# Patient Record
Sex: Male | Born: 1935 | Race: White | Hispanic: No | Marital: Married | Smoking: Former smoker
Health system: Southern US, Community
[De-identification: ages and names within clinical notes are randomized; demographics above are authoritative.]

## PROBLEM LIST (undated history)

## (undated) DIAGNOSIS — I10 Essential (primary) hypertension: Secondary | ICD-10-CM

## (undated) DIAGNOSIS — E119 Type 2 diabetes mellitus without complications: Secondary | ICD-10-CM

## (undated) HISTORY — DX: Essential (primary) hypertension: I10

## (undated) HISTORY — DX: Type 2 diabetes mellitus without complications: E11.9

---

## 2007-11-25 ENCOUNTER — Ambulatory Visit: Payer: Self-pay | Admitting: Gastroenterology

## 2009-03-02 ENCOUNTER — Ambulatory Visit: Payer: Self-pay | Admitting: Specialist

## 2009-03-09 ENCOUNTER — Ambulatory Visit: Payer: Self-pay | Admitting: Specialist

## 2009-05-20 ENCOUNTER — Ambulatory Visit (HOSPITAL_COMMUNITY): Admission: RE | Admit: 2009-05-20 | Discharge: 2009-05-21 | Payer: Self-pay | Admitting: Orthopedic Surgery

## 2009-12-01 ENCOUNTER — Ambulatory Visit: Payer: Self-pay

## 2009-12-08 ENCOUNTER — Ambulatory Visit: Payer: Self-pay | Admitting: Family Medicine

## 2009-12-08 ENCOUNTER — Ambulatory Visit: Payer: Self-pay

## 2009-12-12 ENCOUNTER — Emergency Department: Payer: Self-pay | Admitting: Internal Medicine

## 2009-12-16 ENCOUNTER — Ambulatory Visit: Payer: Self-pay

## 2009-12-21 ENCOUNTER — Ambulatory Visit: Payer: Self-pay

## 2009-12-28 ENCOUNTER — Ambulatory Visit: Payer: Self-pay

## 2009-12-29 ENCOUNTER — Ambulatory Visit: Payer: Self-pay | Admitting: Family Medicine

## 2010-01-02 ENCOUNTER — Ambulatory Visit: Payer: Self-pay

## 2010-01-03 ENCOUNTER — Ambulatory Visit: Payer: Self-pay | Admitting: Internal Medicine

## 2010-01-09 ENCOUNTER — Ambulatory Visit: Payer: Self-pay

## 2010-01-12 ENCOUNTER — Ambulatory Visit: Payer: Self-pay | Admitting: Family Medicine

## 2010-01-13 ENCOUNTER — Ambulatory Visit: Payer: Self-pay

## 2010-01-23 ENCOUNTER — Ambulatory Visit: Payer: Self-pay | Admitting: Internal Medicine

## 2010-01-25 ENCOUNTER — Ambulatory Visit: Payer: Self-pay

## 2010-01-26 ENCOUNTER — Ambulatory Visit: Payer: Self-pay | Admitting: Family Medicine

## 2010-01-30 ENCOUNTER — Ambulatory Visit: Payer: Self-pay

## 2010-02-02 ENCOUNTER — Ambulatory Visit: Payer: Self-pay

## 2010-02-07 ENCOUNTER — Ambulatory Visit: Payer: Self-pay

## 2010-02-09 ENCOUNTER — Ambulatory Visit: Payer: Self-pay

## 2010-02-28 ENCOUNTER — Ambulatory Visit: Payer: Self-pay | Admitting: Internal Medicine

## 2010-03-03 ENCOUNTER — Ambulatory Visit: Payer: Self-pay | Admitting: Internal Medicine

## 2010-03-07 ENCOUNTER — Ambulatory Visit: Payer: Self-pay

## 2010-03-10 ENCOUNTER — Ambulatory Visit: Payer: Self-pay

## 2010-03-15 ENCOUNTER — Ambulatory Visit: Payer: Self-pay

## 2010-03-17 ENCOUNTER — Other Ambulatory Visit: Payer: Self-pay | Admitting: Family Medicine

## 2010-03-22 ENCOUNTER — Ambulatory Visit: Payer: Self-pay

## 2010-03-24 ENCOUNTER — Ambulatory Visit: Payer: Self-pay

## 2010-03-27 ENCOUNTER — Ambulatory Visit: Payer: Self-pay

## 2010-04-03 ENCOUNTER — Ambulatory Visit: Payer: Self-pay

## 2010-04-07 ENCOUNTER — Ambulatory Visit: Payer: Self-pay

## 2010-04-13 ENCOUNTER — Ambulatory Visit: Payer: Self-pay

## 2010-06-13 ENCOUNTER — Ambulatory Visit (HOSPITAL_COMMUNITY)
Admission: RE | Admit: 2010-06-13 | Discharge: 2010-06-13 | Disposition: A | Discharge status: Home / Self Care | Payer: Medicare Other | Source: Ambulatory Visit | Attending: Orthopedic Surgery | Admitting: Orthopedic Surgery

## 2010-06-13 ENCOUNTER — Other Ambulatory Visit (HOSPITAL_COMMUNITY): Payer: Self-pay | Admitting: Orthopedic Surgery

## 2010-06-13 ENCOUNTER — Other Ambulatory Visit: Payer: Self-pay | Admitting: Orthopedic Surgery

## 2010-06-13 ENCOUNTER — Encounter (HOSPITAL_COMMUNITY): Payer: Medicare Other

## 2010-06-13 DIAGNOSIS — Z01810 Encounter for preprocedural cardiovascular examination: Secondary | ICD-10-CM | POA: Insufficient documentation

## 2010-06-13 DIAGNOSIS — Z981 Arthrodesis status: Secondary | ICD-10-CM | POA: Insufficient documentation

## 2010-06-13 DIAGNOSIS — Z01812 Encounter for preprocedural laboratory examination: Secondary | ICD-10-CM | POA: Insufficient documentation

## 2010-06-13 DIAGNOSIS — M169 Osteoarthritis of hip, unspecified: Secondary | ICD-10-CM | POA: Insufficient documentation

## 2010-06-13 DIAGNOSIS — I440 Atrioventricular block, first degree: Secondary | ICD-10-CM | POA: Insufficient documentation

## 2010-06-13 DIAGNOSIS — Z01818 Encounter for other preprocedural examination: Secondary | ICD-10-CM

## 2010-06-13 DIAGNOSIS — M1610 Unilateral primary osteoarthritis, unspecified hip: Secondary | ICD-10-CM | POA: Insufficient documentation

## 2010-06-13 LAB — URINALYSIS, ROUTINE W REFLEX MICROSCOPIC
Ketones, ur: NEGATIVE mg/dL
Nitrite: NEGATIVE
Protein, ur: NEGATIVE mg/dL
Urobilinogen, UA: 1 mg/dL (ref 0.0–1.0)
pH: 6 (ref 5.0–8.0)

## 2010-06-13 LAB — PROTIME-INR
INR: 1.79 — ABNORMAL HIGH (ref 0.00–1.49)
Prothrombin Time: 21 seconds — ABNORMAL HIGH (ref 11.6–15.2)

## 2010-06-13 LAB — COMPREHENSIVE METABOLIC PANEL
ALT: 22 U/L (ref 0–53)
AST: 24 U/L (ref 0–37)
Albumin: 3.5 g/dL (ref 3.5–5.2)
Alkaline Phosphatase: 60 U/L (ref 39–117)
BUN: 25 mg/dL — ABNORMAL HIGH (ref 6–23)
Chloride: 98 mEq/L (ref 96–112)
Potassium: 3.8 mEq/L (ref 3.5–5.1)
Total Bilirubin: 0.7 mg/dL (ref 0.3–1.2)

## 2010-06-13 LAB — CBC
HCT: 37 % — ABNORMAL LOW (ref 39.0–52.0)
Hemoglobin: 12.4 g/dL — ABNORMAL LOW (ref 13.0–17.0)
MCHC: 33.5 g/dL (ref 30.0–36.0)
WBC: 9.6 10*3/uL (ref 4.0–10.5)

## 2010-06-13 LAB — SURGICAL PCR SCREEN: MRSA, PCR: NEGATIVE

## 2010-06-19 ENCOUNTER — Other Ambulatory Visit: Payer: Self-pay | Admitting: Orthopedic Surgery

## 2010-06-19 ENCOUNTER — Inpatient Hospital Stay (HOSPITAL_COMMUNITY)
Admission: RE | Admit: 2010-06-19 | Discharge: 2010-06-22 | DRG: 470 | Disposition: A | Discharge status: Home Health | Payer: Medicare Other | Source: Ambulatory Visit | Attending: Orthopedic Surgery | Admitting: Orthopedic Surgery

## 2010-06-19 DIAGNOSIS — G4733 Obstructive sleep apnea (adult) (pediatric): Secondary | ICD-10-CM | POA: Diagnosis present

## 2010-06-19 DIAGNOSIS — I4891 Unspecified atrial fibrillation: Secondary | ICD-10-CM | POA: Diagnosis present

## 2010-06-19 DIAGNOSIS — N400 Enlarged prostate without lower urinary tract symptoms: Secondary | ICD-10-CM | POA: Diagnosis present

## 2010-06-19 DIAGNOSIS — E7800 Pure hypercholesterolemia, unspecified: Secondary | ICD-10-CM | POA: Diagnosis present

## 2010-06-19 DIAGNOSIS — E119 Type 2 diabetes mellitus without complications: Secondary | ICD-10-CM | POA: Diagnosis present

## 2010-06-19 DIAGNOSIS — I10 Essential (primary) hypertension: Secondary | ICD-10-CM | POA: Diagnosis present

## 2010-06-19 DIAGNOSIS — M169 Osteoarthritis of hip, unspecified: Principal | ICD-10-CM | POA: Diagnosis present

## 2010-06-19 DIAGNOSIS — M1610 Unilateral primary osteoarthritis, unspecified hip: Principal | ICD-10-CM | POA: Diagnosis present

## 2010-06-19 LAB — TYPE AND SCREEN: Antibody Screen: NEGATIVE

## 2010-06-19 LAB — GLUCOSE, CAPILLARY
Glucose-Capillary: 139 mg/dL — ABNORMAL HIGH (ref 70–99)
Glucose-Capillary: 194 mg/dL — ABNORMAL HIGH (ref 70–99)

## 2010-06-19 LAB — ABO/RH: ABO/RH(D): AB POS

## 2010-06-20 LAB — CBC
MCV: 93.1 fL (ref 78.0–100.0)
Platelets: 212 10*3/uL (ref 150–400)
RBC: 3.48 MIL/uL — ABNORMAL LOW (ref 4.22–5.81)
RDW: 16.9 % — ABNORMAL HIGH (ref 11.5–15.5)

## 2010-06-20 LAB — GLUCOSE, CAPILLARY
Glucose-Capillary: 162 mg/dL — ABNORMAL HIGH (ref 70–99)
Glucose-Capillary: 156 mg/dL — ABNORMAL HIGH (ref 70–99)
Glucose-Capillary: 139 mg/dL — ABNORMAL HIGH (ref 70–99)
Glucose-Capillary: 145 mg/dL — ABNORMAL HIGH (ref 70–99)

## 2010-06-20 LAB — BASIC METABOLIC PANEL
BUN: 20 mg/dL (ref 6–23)
Calcium: 8.3 mg/dL — ABNORMAL LOW (ref 8.4–10.5)
Creatinine, Ser: 0.99 mg/dL (ref 0.4–1.5)
GFR calc non Af Amer: >60 mL/min (ref >60)
Glucose, Bld: 150 mg/dL — ABNORMAL HIGH (ref 70–99)

## 2010-06-20 LAB — PROTIME-INR: INR: 1.18 (ref 0.00–1.49)

## 2010-06-21 LAB — PROTIME-INR
INR: 1.67 — ABNORMAL HIGH (ref 0.00–1.49)
Prothrombin Time: 19.9 seconds — ABNORMAL HIGH (ref 11.6–15.2)

## 2010-06-21 LAB — GLUCOSE, CAPILLARY: Glucose-Capillary: 150 mg/dL — ABNORMAL HIGH (ref 70–99)

## 2010-06-21 LAB — BASIC METABOLIC PANEL
BUN: 12 mg/dL (ref 6–23)
CO2: 26 mEq/L (ref 19–32)
Chloride: 99 mEq/L (ref 96–112)
Creatinine, Ser: 0.88 mg/dL (ref 0.4–1.5)

## 2010-06-21 LAB — CBC
MCHC: 33.8 g/dL (ref 30.0–36.0)
MCV: 91.8 fL (ref 78.0–100.0)
RDW: 16.6 % — ABNORMAL HIGH (ref 11.5–15.5)
WBC: 11.9 10*3/uL — ABNORMAL HIGH (ref 4.0–10.5)

## 2010-06-22 LAB — PROTIME-INR
INR: 2.49 — ABNORMAL HIGH (ref 0.00–1.49)
Prothrombin Time: 27 seconds — ABNORMAL HIGH (ref 11.6–15.2)

## 2010-06-22 LAB — CBC
MCHC: 33.2 g/dL (ref 30.0–36.0)
RDW: 16.7 % — ABNORMAL HIGH (ref 11.5–15.5)

## 2010-06-22 LAB — GLUCOSE, CAPILLARY: Glucose-Capillary: 144 mg/dL — ABNORMAL HIGH (ref 70–99)

## 2010-06-22 LAB — BASIC METABOLIC PANEL
BUN: 20 mg/dL (ref 6–23)
Creatinine, Ser: 1.04 mg/dL (ref 0.4–1.5)
GFR calc non Af Amer: >60 mL/min (ref >60)

## 2010-06-29 NOTE — Op Note (Signed)
NAME:  [name] [name], [name] [name]                ACCOUNT NO.:  [id number]  MEDICAL RECORD NO.:  [id number]           PATIENT TYPE:  I  LOCATION:  0011                         FACILITY:  [facility]  PHYSICIAN:  Ollen Gross, M.D.    DATE OF BIRTH:  1935/11/18  DATE OF PROCEDURE:  06/19/2010 DATE OF DISCHARGE:  06/13/2010                              OPERATIVE REPORT   PREOPERATIVE DIAGNOSIS:  Osteoarthritis, left hip.  POSTOPERATIVE DIAGNOSIS:  Osteoarthritis, left hip.  PROCEDURE:  Left total hip arthroplasty.  SURGEON:  Ollen Gross, M.D.  ASSISTANT:  Alexzandrew L. Perkins, P.A.C.  ANESTHESIA:  General.  ESTIMATED BLOOD LOSS:  600.  DRAIN:  Hemovac x1.  COMPLICATIONS:  None.  CONDITION:  Stable to the recovery room.  BRIEF CLINICAL NOTE:  [name] is a 75 year old male with advanced end- stage arthritis of the left hip, progressively worsening pain and dysfunction.  He has failed nonoperative management and presents for total hip arthroplasty.  PROCEDURE IN DETAIL:  After successful administration of general anesthetic, patient is placed in right lateral decubitus position with the left side up and held with the hip positioner.  Left lower extremity isolated from his perineum of plastic drapes and prepped and draped in the usual sterile fashion.  Short posterolateral incision is made with a 10 blade through the subcutaneous tissue to the fascia lata which incised in line with the skin incision.  Sciatic nerve was palpated and protected and short rotators and capsule isolated off the femur.  Hip is dislocated and the center of the femoral head is marked.  A trial prosthesis is placed such that the center of the trial head corresponds to the center of his native femoral head.  Osteotomy line is marked on the femoral neck and osteotomy made with an oscillating saw.  Femoral head is then removed.  Retractors were placed around the proximal femur to gain exposure of the femoral  canal.  Starter reamer was passed and then the canal was thoroughly irrigated with saline to remove fatty contents.  Axial reaming is performed up to 17.5 mm proximal reaming to a 61F and the sleeve machined to a small.  A 61F small trial sleeve was then placed.  The femur was retracted anteriorly to gain acetabular exposure. Acetabular retractors were placed and the labrum and osteophytes removed. Acetabular informed to 53 mm, replacement with 54-mm Pinnacle acetabular shell.  This was placed in anatomic position and transfixed with 2 dome screws.  The 36 mm neutral +4 marathon liner was then placed.  The trial femoral stem which of a 22 x 17 with a 36 +8 neck matching native anteversion is placed.  A 36 +0 head is placed.  Hips reduced with outstanding stability.  There is full extension, full external rotation, 70 degrees flexion, 40 degrees adduction, about 80 degrees internal rotation, 90 degrees of flexion, and 70 degrees of internal rotation.  By placing the left leg on top of the right, it felt that the leg lengths were equal.  Hip was then dislocated.  All trials are removed.  The permanent 61F small sleeve was placed and a  22 x 17 stem with a 36 +8 neck was impacted matching native anteversion.  A 36 +0 head is placed and the hip was reduced with the same stability parameters.  The wound was copiously irrigated with saline solution and short rotators and capsule reattached to the femur through drill holes with Ethibond suture.  The fascia lata was closed over Hemovac drain with interrupted #1 Vicryl, subcutaneous closed with #1 and  #2-0 Vicryl, and subcuticular running 4-0 Monocryl.  The catheter for the Marcaine pain pump is placed and the pump is initiated.  The incisions cleaned and dried, and Steri-Strips and a bulky sterile dressing are applied.  He has been placed into a knee immobilizer, awakened, and transferred to recovery in stable condition.     Ollen Gross,  M.D.     FA/MEDQ  D:  06/19/2010  T:  06/19/2010  Job:  [id number]  Electronically Signed by Ollen Gross M.D. on 06/28/2010 03:46:01 PM

## 2010-07-10 LAB — COMPREHENSIVE METABOLIC PANEL
AST: 28 U/L (ref 0–37)
CO2: 28 mEq/L (ref 19–32)
Calcium: 9.2 mg/dL (ref 8.4–10.5)
Creatinine, Ser: 1.05 mg/dL (ref 0.4–1.5)
GFR calc Af Amer: >60 mL/min (ref >60)
GFR calc non Af Amer: >60 mL/min (ref >60)

## 2010-07-10 LAB — DIFFERENTIAL
Eosinophils Relative: 2 % (ref 0–5)
Lymphocytes Relative: 30 % (ref 12–46)
Lymphs Abs: 1.8 10*3/uL (ref 0.7–4.0)
Neutro Abs: 3.7 10*3/uL (ref 1.7–7.7)

## 2010-07-10 LAB — CBC
MCHC: 34.6 g/dL (ref 30.0–36.0)
MCV: 97.9 fL (ref 78.0–100.0)
Platelets: 290 10*3/uL (ref 150–400)
RBC: 3.95 MIL/uL — ABNORMAL LOW (ref 4.22–5.81)

## 2010-07-10 LAB — PROTIME-INR
INR: 1.09 (ref 0.00–1.49)
Prothrombin Time: 14 seconds (ref 11.6–15.2)

## 2010-07-10 LAB — APTT: aPTT: 32 seconds (ref 24–37)

## 2010-07-26 NOTE — Discharge Summary (Signed)
NAME:  [name] [name]                ACCOUNT NO.:  [id number]  MEDICAL RECORD NO.:  [id number]           PATIENT TYPE:  I  LOCATION:  1601                         FACILITY:  [facility]  PHYSICIAN:  Ollen Gross, M.D.    DATE OF BIRTH:  Aug 31, 1935  DATE OF ADMISSION:  06/19/2010 DATE OF DISCHARGE:  06/22/2010                              DISCHARGE SUMMARY   ADMITTING DIAGNOSES: 1. Osteoarthritis, left hip. 2. Hypertension. 3. Sleep apnea. 4. Hypercholesterolemia. 5. History of paroxysmal atrial fibrillation. 6. Non-insulin-dependent diabetes mellitus. 7. Mild benign prostatic hypertrophy. 8. Past history of left leg cellulitis.  DISCHARGE DIAGNOSES: 1. Osteoarthritis, left hip, status post left total hip replacement     and arthroplasty. 2. Postop hyponatremia. 3. Hypertension. 4. Sleep apnea. 5. Hypercholesterolemia. 6. History of paroxysmal atrial fibrillation. 7. Non-insulin-dependent diabetes mellitus. 8. Mild benign prostatic hypertrophy. 9. Past history of left leg cellulitis.  PROCEDURE:  June 19, 2010, left total hip.  SURGEON:  Ollen Gross, MD  ASSISTANT:  Alexzandrew L. Perkins, PA-C  ANESTHESIA:  General.  BLOOD LOSS:  600 mL.  CONSULTS:  None.  BRIEF HISTORY:  [name] [name] is a 75 year old male with advanced arthritis of left hip, progressive worsening pain and dysfunction.  He has failed nonoperative management, now presents for total hip arthroplasty.  LABORATORY DATA:  Preoperative CBC showed hemoglobin 12.4, hematocrit 37.0, white cell count was 9.6, platelets 212.  Chem panel on admission showed elevated BUN of 25, glucose elevated at 136, known diabetic. Remaining Chem panel within normal limits.  PT/INR 21.0 and 1.79 with a PTT of 65.  Preop UA showed small bili, otherwise negative.  Nasal swabs were positive for staph aureus but negative for MRSA.  Serial CBCs were followed.  Hemoglobin dropped down to 10.6, back up to 11.  Last H and H 10.1  and 30.4.  Serial protime followed per Coumadin protocol.  Last PT/INR 27.0 and 2.49.  Serial BMETs were followed.  Glucose went up to 150, back down to 126.  Sodium did drop down to 132, was last noted at 131.  Remaining electrolytes remained within normal limits.  Blood group type AB positive.  HOSPITAL COURSE:  The patient was admitted to [facility] and taken to the operating room, underwent the above-stated procedure without complication.  The patient tolerated the procedure well and later transferred to recovery room at orthopedic floor, started on p.o. and IV analgesic pain control following surgery, did fairly well on the evening of surgery but on the early morning hours of postop day #1, the patient developed some nausea and vomiting and did get up from sleep, give him antiemetics as needed.  The Pradaxa which the patient was on preoperatively was held postoperatively.  He was started back on his blood pressure meds and his heart regulatory medications because of the history of paroxysmal atrial fibrillation.  He had decent output.  We followed his urine output daily.  By day #2, his hemoglobin was stable at 11.  Dressing was changed, incision looked good.  The Sodium was down a little bit down to 132.  He started to progress  with his therapy.  By day #3, he was meeting his goals.  His sodium was 131.  We will allow to concentrate up on its own. The incision looked good.  He was progressing with his therapy and the patient was discharged home at this time.  DISCHARGE PLAN: 1. The patient was discharged home on June 22, 2010. 2. Discharge diagnoses, please see above. 3. Discharge medications, Percocet, Robaxin, Coumadin.  Continue     amiodarone, digoxin, furosemide, glipizide, Lipitor, lisinopril,     metoprolol, morphine sulfate, potassium chloride, and tamsulosin.  DIET:  Heart-healthy diet.  ACTIVITY:  Partial weightbearing 25 to 50%.  Hip precautions  total protocol.  Home health PT for therapy.  DIET:  Heart-healthy diet.  DISPOSITION:  Home.  CONDITION ON DISCHARGE:  Improved.     Alexzandrew L. Julien Girt, P.A.C.   ______________________________ Ollen Gross, M.D.    ALP/MEDQ  D:  07/20/2010  T:  07/20/2010  Job:  [id number]  cc:   Dr. Alden Hipp  Electronically Signed by Patrica Duel P.A.C. on 07/26/2010 62:13:08 AM Electronically Signed by Ollen Gross M.D. on 07/26/2010 09:53:31 AM

## 2010-07-26 NOTE — H&P (Signed)
  NAME:  [name] [name], [name] [name]                ACCOUNT NO.:  [id number]  MEDICAL RECORD NO.:  [id number]           PATIENT TYPE:  I  LOCATION:  1601                         FACILITY:  [facility]  PHYSICIAN:  Ollen Gross, M.D.    DATE OF BIRTH:  1935/08/11  DATE OF ADMISSION:  06/19/2010 DATE OF DISCHARGE:  06/22/2010                             HISTORY & PHYSICAL   CHIEF COMPLAINT:  Left hip pain.  HISTORY OF PRESENT ILLNESS:  The patient is a 75 year old male who is seen by Dr. Lequita Halt for ongoing left hip pain.  He has known arthritis and has been progressively getting worse with time.  It is felt he would benefit from undergoing surgical intervention.  Risks and benefits have been discussed.  He elects to proceed with surgery.  ALLERGIES:  No known drug allergies.  CURRENT MEDICATIONS: 1. Lipitor. 2. Lisinopril. 3. Metoprolol. 4. Pradaxa. 5. Tamsulosin. 6. Potassium. 7. Furosemide. 8. Amiodarone. 9. Glipizide. 10.Digoxin.  PAST MEDICAL HISTORY: 1. Hypertension. 2. Sleep apnea, uses CPAP. 3. Hypercholesterolemia. 4. Recent bout of atrial fibrillation, paroxysmal in nature.  He has     had a cardioversion. 5. Non-insulin diabetes mellitus. 6. Mild BPH. 7. History of left leg cellulitis.  PAST SURGICAL HISTORY:  Rotator cuff, left shoulder.  FAMILY HISTORY:  Father with heart attack.  Mother deceased from natural causes.  SOCIAL HISTORY:  Married, retired, past smoker, quit about 2 years ago, 6 ounces of alcohol per week.  He does have caregiver lined up, has 2 steps entering his home.  Does have a living will and healthcare power of attorney.  REVIEW OF SYSTEMS:  GENERAL:  No fevers, chills, night sweats. NEUROLOGIC:  No seizure, syncope, or paralysis.  RESPIRATORY:  No shortness breath, productive cough, or hemoptysis.  CARDIOVASCULAR:  No chest pain or orthopnea.  GI:  No nausea, vomiting, diarrhea, or constipation.  GU:  No dysuria, hematuria, or  discharge. MUSCULOSKELETAL:  Left hip.  PHYSICAL EXAMINATION:  VITAL SIGNS:  Pulse 80, respirations 12, blood pressure 142/64. GENERAL:  A 75 year old white male well-nourished, well-developed, overweight, obese, no acute distress.  He is alert, oriented, cooperative, accompanied by his wife. HEENT:  Normocephalic, atraumatic.  Pupils are round and reactive.  EOMs intact. NECK:  Supple. CHEST:  Clear, barrel-chested individual. HEART:  Regular rate and rhythm without murmur. ABDOMEN:  Soft, round, protuberant.  Bowel sounds present. RECTAL, BREASTS, GENITALIA:  Not done and not pertinent to present illness. EXTREMITIES:  Left hip flexion 90, internal rotation 20 degrees, external rotation 20 degrees abduction.  IMPRESSION:  Osteoarthritis, left hip.  PLAN:  The patient will be admitted to [facility] to undergo a left total replacement arthroplasty.  Surgery will be performed by Dr. Ollen Gross.     Alexzandrew L. Julien Girt, P.A.C.   ______________________________ Ollen Gross, M.D.    ALP/MEDQ  D:  06/25/2010  T:  06/26/2010  Job:  [id number]  cc:   Ollen Gross, M.D. Fax: [phone]  Dr. Alden Hipp  Electronically Signed by Patrica Duel P.A.C. on 07/26/2010 07:18:36 AM Electronically Signed by Ollen Gross M.D. on 07/26/2010 09:53:33 AM

## 2010-08-11 ENCOUNTER — Ambulatory Visit: Payer: Self-pay | Admitting: Internal Medicine

## 2010-09-22 ENCOUNTER — Ambulatory Visit: Payer: Self-pay | Admitting: Unknown Physician Specialty

## 2010-09-26 ENCOUNTER — Ambulatory Visit: Payer: Self-pay | Admitting: General Surgery

## 2010-09-28 LAB — PATHOLOGY REPORT

## 2010-10-02 ENCOUNTER — Emergency Department: Payer: Self-pay | Admitting: Emergency Medicine

## 2010-10-27 ENCOUNTER — Encounter (HOSPITAL_COMMUNITY)
Admission: RE | Admit: 2010-10-27 | Discharge: 2010-10-27 | Disposition: A | Discharge status: Home / Self Care | Payer: Medicare Other | Source: Ambulatory Visit | Attending: Orthopedic Surgery | Admitting: Orthopedic Surgery

## 2010-10-27 LAB — COMPREHENSIVE METABOLIC PANEL
AST: 50 U/L — ABNORMAL HIGH (ref 0–37)
Albumin: 3.4 g/dL — ABNORMAL LOW (ref 3.5–5.2)
BUN: 21 mg/dL (ref 6–23)
CO2: 25 mEq/L (ref 19–32)
Calcium: 9.6 mg/dL (ref 8.4–10.5)
Creatinine, Ser: 1.09 mg/dL (ref 0.50–1.35)
GFR calc non Af Amer: >60 mL/min (ref >60)

## 2010-10-27 LAB — DIFFERENTIAL
Basophils Absolute: 0 10*3/uL (ref 0.0–0.1)
Basophils Relative: 0 % (ref 0–1)
Eosinophils Relative: 2 % (ref 0–5)
Lymphs Abs: 2 10*3/uL (ref 0.7–4.0)
Monocytes Relative: 6 % (ref 3–12)
Neutro Abs: 4.7 10*3/uL (ref 1.7–7.7)
Neutrophils Relative %: 64 % (ref 43–77)

## 2010-10-27 LAB — PROTIME-INR
INR: 2.24 — ABNORMAL HIGH (ref 0.00–1.49)
Prothrombin Time: 25.2 seconds — ABNORMAL HIGH (ref 11.6–15.2)

## 2010-10-27 LAB — CBC
Hemoglobin: 10.2 g/dL — ABNORMAL LOW (ref 13.0–17.0)
Platelets: 211 10*3/uL (ref 150–400)
RBC: 3.83 MIL/uL — ABNORMAL LOW (ref 4.22–5.81)
WBC: 7.2 10*3/uL (ref 4.0–10.5)

## 2010-10-27 LAB — SURGICAL PCR SCREEN
MRSA, PCR: NEGATIVE
Staphylococcus aureus: POSITIVE — AB

## 2010-10-31 ENCOUNTER — Ambulatory Visit (HOSPITAL_COMMUNITY)
Admission: RE | Admit: 2010-10-31 | Discharge: 2010-11-01 | Disposition: A | Discharge status: Home / Self Care | Payer: Medicare Other | Source: Ambulatory Visit | Attending: Orthopedic Surgery | Admitting: Orthopedic Surgery

## 2010-10-31 DIAGNOSIS — F411 Generalized anxiety disorder: Secondary | ICD-10-CM | POA: Insufficient documentation

## 2010-10-31 DIAGNOSIS — Z01810 Encounter for preprocedural cardiovascular examination: Secondary | ICD-10-CM | POA: Insufficient documentation

## 2010-10-31 DIAGNOSIS — M25819 Other specified joint disorders, unspecified shoulder: Secondary | ICD-10-CM | POA: Insufficient documentation

## 2010-10-31 DIAGNOSIS — M19019 Primary osteoarthritis, unspecified shoulder: Secondary | ICD-10-CM | POA: Insufficient documentation

## 2010-10-31 DIAGNOSIS — Z79899 Other long term (current) drug therapy: Secondary | ICD-10-CM | POA: Insufficient documentation

## 2010-10-31 DIAGNOSIS — Z7901 Long term (current) use of anticoagulants: Secondary | ICD-10-CM | POA: Insufficient documentation

## 2010-10-31 DIAGNOSIS — M129 Arthropathy, unspecified: Secondary | ICD-10-CM | POA: Insufficient documentation

## 2010-10-31 DIAGNOSIS — Z87891 Personal history of nicotine dependence: Secondary | ICD-10-CM | POA: Insufficient documentation

## 2010-10-31 DIAGNOSIS — G4733 Obstructive sleep apnea (adult) (pediatric): Secondary | ICD-10-CM | POA: Insufficient documentation

## 2010-10-31 DIAGNOSIS — K219 Gastro-esophageal reflux disease without esophagitis: Secondary | ICD-10-CM | POA: Insufficient documentation

## 2010-10-31 DIAGNOSIS — S43429A Sprain of unspecified rotator cuff capsule, initial encounter: Secondary | ICD-10-CM | POA: Insufficient documentation

## 2010-10-31 DIAGNOSIS — I4891 Unspecified atrial fibrillation: Secondary | ICD-10-CM | POA: Insufficient documentation

## 2010-10-31 DIAGNOSIS — G8929 Other chronic pain: Secondary | ICD-10-CM | POA: Insufficient documentation

## 2010-10-31 DIAGNOSIS — Z01812 Encounter for preprocedural laboratory examination: Secondary | ICD-10-CM | POA: Insufficient documentation

## 2010-10-31 DIAGNOSIS — X58XXXA Exposure to other specified factors, initial encounter: Secondary | ICD-10-CM | POA: Insufficient documentation

## 2010-10-31 DIAGNOSIS — I10 Essential (primary) hypertension: Secondary | ICD-10-CM | POA: Insufficient documentation

## 2010-10-31 DIAGNOSIS — E119 Type 2 diabetes mellitus without complications: Secondary | ICD-10-CM | POA: Insufficient documentation

## 2010-10-31 LAB — APTT: aPTT: 29 seconds (ref 24–37)

## 2010-10-31 LAB — GLUCOSE, CAPILLARY: Glucose-Capillary: 107 mg/dL — ABNORMAL HIGH (ref 70–99)

## 2010-10-31 LAB — TYPE AND SCREEN: ABO/RH(D): AB POS

## 2010-10-31 LAB — ABO/RH: ABO/RH(D): AB POS

## 2010-10-31 LAB — SAMPLE TO BLOOD BANK

## 2010-10-31 LAB — PROTIME-INR: Prothrombin Time: 15.6 seconds — ABNORMAL HIGH (ref 11.6–15.2)

## 2010-11-01 LAB — CBC
Hemoglobin: 10.1 g/dL — ABNORMAL LOW (ref 13.0–17.0)
MCH: 26.4 pg (ref 26.0–34.0)
Platelets: 245 10*3/uL (ref 150–400)
RBC: 3.82 MIL/uL — ABNORMAL LOW (ref 4.22–5.81)
WBC: 10.4 10*3/uL (ref 4.0–10.5)

## 2010-11-01 LAB — COMPREHENSIVE METABOLIC PANEL
ALT: 28 U/L (ref 0–53)
AST: 38 U/L — ABNORMAL HIGH (ref 0–37)
Alkaline Phosphatase: 62 U/L (ref 39–117)
CO2: 23 mEq/L (ref 19–32)
Calcium: 8.3 mg/dL — ABNORMAL LOW (ref 8.4–10.5)
Glucose, Bld: 138 mg/dL — ABNORMAL HIGH (ref 70–99)
Potassium: 4.1 mEq/L (ref 3.5–5.1)
Sodium: 133 mEq/L — ABNORMAL LOW (ref 135–145)
Total Protein: 6.2 g/dL (ref 6.0–8.3)

## 2010-11-01 LAB — LIPID PANEL
HDL: 28 mg/dL — ABNORMAL LOW (ref >39)
LDL Cholesterol: 37 mg/dL (ref 0–99)
Total CHOL/HDL Ratio: 3.8 RATIO

## 2010-11-01 LAB — APTT: aPTT: 32 seconds (ref 24–37)

## 2010-11-01 LAB — DIGOXIN LEVEL: Digoxin Level: 0.3 ng/mL — ABNORMAL LOW (ref 0.8–2.0)

## 2010-11-01 LAB — PROTIME-INR: Prothrombin Time: 15.2 seconds (ref 11.6–15.2)

## 2010-11-01 LAB — GLUCOSE, CAPILLARY: Glucose-Capillary: 137 mg/dL — ABNORMAL HIGH (ref 70–99)

## 2010-11-05 NOTE — Op Note (Signed)
NAME:  [name] [name], [name] [name]                ACCOUNT NO.:  [id number]  MEDICAL RECORD NO.:  [id number]  LOCATION:  2019                         FACILITY:  [facility]  PHYSICIAN:  Dionne Ano. Bay Jarquin, M.D.DATE OF BIRTH:  May 13, 1935  DATE OF PROCEDURE: DATE OF DISCHARGE:                              OPERATIVE REPORT   PREOPERATIVE DIAGNOSES: 1. Right shoulder rotator cuff tear with impingement syndrome. 2. Acromioclavicular joint arthritis and chronic pain with inability     to lift the arm.  POSTOPERATIVE DIAGNOSES: 1. Right shoulder rotator cuff tear with impingement syndrome. 2. Acromioclavicular joint arthritis and chronic pain with inability     to lift the arm.  PROCEDURES: 1. Arthroscopy, right shoulder with labral debridement and biceps     tenotomy. 2. Arthroscopic synovectomy of the glenohumeral joint. 3. Arthroscopic subacromial decompression and bursectomy. 4. Arthroscopic distal clavicle resection. 5. Mini open rotator cuff repair.  This with a massive cuff tear     involving infra and supraspinatus tendons.  SURGEON:  Dionne Ano. Amanda Pea, MD  ASSISTANT:  Karie Chimera, PAC.  COMPLICATIONS:  None.  ANESTHESIA:  General with preoperative block.  TOURNIQUET TIME:  Zero.  ESTIMATED BLOOD LOSS:  Minimal.  INDICATIONS FOR THE PROCEDURE:  A 75 year old male who had an acute traumatic rotator cuff tear.  He understands risks and benefits of the surgery and desires to proceed.  MRI confirms the above-mentioned diagnosis as the subjective examined.  He understands risks and benefits as I had previously performed a left rotator cuff repair on him in the past as well.  OPERATION:  After seeing him by myself and Anesthesia, he was taken to the operating suite and underwent smooth induction of general anesthesia.  He was laid spine, fully padded, prepped and draped in usual sterile fashion.  Betadine scrub and paint followed by DuraPrep about the arm.  Prior to sterile prep and  drape, the patient was placed in beach-chair position.  SCDs were placed on his legs and time-out was called with consent verified.  The patient was well padded, pulses were checked, and the operation commenced with sterile draping.  Once this was done, outline marks were made, and I placed a posterior incision about the soft spot.  Following this, cannula was placed inside the shoulder joint.  The shoulder was then insufflated and evaluation was accomplished.  He had a torn biceps tendon and labral tearing.  I performed a biceps tenotomy through an anterior working portal, which I created with small stab incision after localization with spinal needle. Through the anterior portal, I performed a synovectomy.  I also performed a biceps tenotomy given the frayed biceps tendon.  Following this, I performed a labral debridement with arthroscopic shaver and thermal ablator.  I made sure that the thermal ablator did not touch the cartilage and at all times made sure that the cartilage was not overheated.  Once this was done, the rotator cuff tear was evaluated.  I felt that it was acceptable for an attempted repair, but felt that a mini open would serve him best given the retraction.  At this time, I placed our scope in the subacromial space and performed a bursectomy.  Following  bursectomy, I performed a subacromial decompression with combination bur, ablator, and shaver.  Once this was done, I then performed a distal clavicle resection arthroscopically.  This was done through an anterior working portal. Thermal ablator, shaver, and arthroscopic bur were used to remove 9 mm of the distal clavicle.  Following this, I then irrigated copiously and made a small mini open incision.  Dissection was carried down.  The area between the anterior and middle deltoid raphe was created.  I took care not to go below 3-4 cm from the edge of the acromion.  Dissection was carried down.  Rotator cuff tear was  verified and was mobilized with combination of finger glove application and a Cobb elevator.  Stay suture was placed of 2-0 Vicryl this to the cuff and it was mobilized. Following this, 3 knots with 4 exiting sutures piece were placed just off the footprint, which I created.  I medialized the cuff somewhat due to the retraction in the severity.  Following this, the sutures were then placed through the medial portion of the rotator cuff with scorpion device.  I should note that the subscapularis was intact and was not heavily damaged.  I did not perform a formal biceps tenodesis as I felt the patient would do just fine with the biceps tenotomy given his age and activity level.  Following this, 6 knots were created via 12 sutures in the cuff and the cuff sat down nicely on the footprint.  Once this was done, combination of push lock anchors on the most anterior and posterior regions were placed and a Biomet headless anchor was used in similar fashion to the push lock to provide a pants-over-vest repair/double-tear repair of the rotator cuff.  The cuff looked great. I was very pleased with this, mobilized nicely, and there was no impingement.  I checked subacromial space and all areas and things looked very well.  I irrigated copiously as I did it multiple points during the operative procedure and then closed the deltoid raphe with 0 Vicryl followed by 3-0 Vicryl in the subcu and subcuticular stitch in the skin edge.  Portals were closed with Prolene.  The patient tolerated this well.  There were no complicating features.  All sponge, needle, and instrument counts were reported as correct. Drapes were removed.  He was taken to the postop recovery area in stable condition and will be monitored.  We will plan for observation, IV antibiotics, pain management, and our standard protocol postop for a severe/massive rotator cuff repair.  I will have him passive range of motion 0-6 weeks, active  assisted motion 6-10 weeks, and begin active range of motion at 10-12 weeks.  These notes have been discussed, all questions have been encouraged and answered.     Dionne Ano. Amanda Pea, M.D.     [facility]  D:  10/31/2010  T:  11/01/2010  Job:  [id number]  Electronically Signed by Dominica Severin M.D. on 11/05/2010 06:54:59 AM

## 2010-11-08 NOTE — Consult Note (Signed)
NAME:  [name] [name], [name] [name]                ACCOUNT NO.:  [id number]  MEDICAL RECORD NO.:  [id number]  LOCATION:  SDSC                         FACILITY:  [facility]  PHYSICIAN:  Standley Dakins, MD   DATE OF BIRTH:  [birth year]  DATE OF CONSULTATION:  10/31/2010 DATE OF DISCHARGE:                                CONSULTATION   REFERRING PHYSICIAN:  Dionne Ano. Gramig, MD  REASON FOR CONSULTATION:  Evaluation and management recommendations for hypertension, hyperlipidemia, atrial fibrillation, obstructive sleep apnea, and other medical problems.  HISTORY OF PRESENT ILLNESS:  This patient is a pleasant 75 year old male, who is in postop day #0 status post having rotator cuff surgery on the right shoulder.  The patient is seen in the PACU.  He recently had surgery by Dr. Amanda Pea and is postop.  The patient has a history of hypertension, hyperlipidemia, diabetes mellitus type 2, atrial fibrillation, and anemia.  The patient was cardioverted at [facility] and has remained on Pradaxa and has had a controlled rate for quite some time.  The patient reports that his other medical conditions have been relatively stable.  He follows closely with [facility] for cardiology care and ambulatory care. Postoperatively, the patient has done well.  His heart rate has remained sinus rhythm and rate controlled at approximately 60.  His blood pressures have remained well controlled.  His Pradaxa had been stopped preoperatively and per the orthopedic specialist will be restarted tomorrow evening.  PAST MEDICAL HISTORY: 1. Hypertension. 2. Anxiety disorder. 3. Past smoker, stopped in June 2010. 4. Morbid obesity. 5. Erectile dysfunction. 6. Diabetes mellitus type 2. 7. Atrial fibrillation diagnosed in July 2011, on Pradaxa for     anticoagulation. 8. Obstructive sleep apnea - severe. 9. Hyperlipidemia. 10.Anemia. 11.Status post cervical spine surgery. 12.Status post cervical  diskectomy in 2007 and status post left     rotator cuff repair.  HOME MEDICATIONS: 1. Benadryl 25 mg 2 tablets p.o. daily at bedtime p.r.n. 2. Potassium chloride 20 mEq p.o. daily. 3. Pradaxa 150 mg p.o. b.i.d. 4. Tamsulosin 0.4 mg 1 capsule every morning. 5. Metoprolol tartrate 100 mg one p.o. twice daily. 6. Lisinopril 20 mg one p.o. twice daily. 7. Lipitor 20 mg one p.o. daily every evening. 8. Glipizide XL 2.5 mg one p.o. every morning. 9. Furosemide 40 mg 2 tablets p.o. every morning. 10.Digoxin 0.125 mg p.o. every other day. 11.Amiodarone 200 mg 1 tablet p.o. every morning.  ALLERGIES:  No known drug allergies.  FAMILY HISTORY:  Significant for heart disease in his father and brother.  SOCIAL HISTORY:  This patient is married.  He is retired.  He is a former smoker.  He quit in 2010.  Reports about 6 ounces of alcohol per week.  He does have a living will and health care power of attorney in the possession of his family.  REVIEW OF SYSTEMS:  Significant for right shoulder pain, fatigue, no shortness of breath, no chest pain, no rash, no significant edema. Please see HPI for positives.  Otherwise all systems reviewed completely and reported as negative.  PHYSICAL EXAMINATION:  CURRENT VITAL SIGNS:  Temperature 98.4, pulse 57, respirations 18, blood pressure 135/70, pulse  ox 95% on room air. GENERAL:  A 75 year old male, appears stated age, in no distress, cooperative and pleasant, awake, alert, and oriented. HEENT:  Normocephalic, atraumatic.  Pupils equally round and reactive to light.  Sclera clear.  Extraocular movements intact. NECK:  Supple.  Thyroid soft.  No nodules or masses palpated.  No JVD. LUNGS:  Bilateral breath sounds, clear to auscultation, barrel-chested. CARDIAC:  Normal S1 and S2 sounds, regular without murmurs, rubs, or gallops. ABDOMEN:  Obese, round, soft.  Bowel sounds present.  No hepatosplenomegaly, guarding, or rebound tenderness  noted. EXTREMITIES:  SCDs bilateral lower extremities.  No pretibial edema, cyanosis, or clubbing.  Pedal pulses 2+ bilaterally.  The patient's right shoulder is postop in a sling at this time. NEUROLOGICAL:  No focal deficits.  Awake, alert, and oriented x4. Moving all extremities and no focal deficits. SKIN:  No gross lesions noted.  LABORATORY DATA:  Blood type AB positive.  PTT 29, PT 15.6, INR 1.21. Sodium 136, potassium 5.2, chloride 99, bicarb 25, glucose 111, BUN 21, creatinine 1.09, bilirubin 0.5, AST 50, ALT 25, calcium 9.6.  White blood cell count of 7.2, hemoglobin 10.2, hematocrit 31.7, platelet count 211.  These labs were from October 27, 2010 except for the hematology labs as mentioned above, which were done on October 31, 2010.  IMPRESSION: 1. Atrial fibrillation status post cardioversion, chronically     anticoagulated with Pradaxa. 2. Hypertension. 3. Severe obstructive sleep apnea. 4. Hyperlipidemia. 5. Anemia. 6. Anxiety disorder. 7. Diabetes mellitus type 2. 8. Morbid obesity. 9. Postop day 0 status post right rotator cuff repair.  PLAN COMMENTS AND RECOMMENDATIONS: 1. For safety purposes, the patient is going to be admitted to a     telemetry monitored bed to evaluate and catch any possible     postoperative atrial fibrillation.  The patient will be restarted     on Pradaxa tomorrow evening. 2. Monitor blood glucose closely and provide supplemental insulin to     keep blood glucose controlled. 3. Monitor blood pressure closely. 4. Resume home medications for blood pressure and resume     antiarrhythmics. 5. Monitor electrolytes and CBC in the morning. 6. Because the patient has severe obstructive sleep apnea, I am     recommending that he have his CPAP tonight.  I am going to order     for respiratory therapy to please start him on CPAP tonight. 7. Check a hemoglobin A1c to assess diabetes control. 8. Postop care per orthopedic specialist team. 9. We will  continue to follow along with you and make adjustments and     recommendations as required.  Thank you very much for this consultation.  Recommend the patient to follow up with his Cardiology team and [facility] Team after discharge.     Standley Dakins, MD     CJ/MEDQ  D:  10/31/2010  T:  10/31/2010  Job:  [id number]  Electronically Signed by Standley Dakins  on 11/08/2010 06:10:34 PM

## 2011-04-25 ENCOUNTER — Ambulatory Visit: Payer: Self-pay | Admitting: Urology

## 2011-08-03 ENCOUNTER — Encounter: Payer: Self-pay | Admitting: Nurse Practitioner

## 2011-08-03 ENCOUNTER — Encounter: Payer: Self-pay | Admitting: Cardiothoracic Surgery

## 2011-08-11 ENCOUNTER — Other Ambulatory Visit: Payer: Self-pay | Admitting: Family Medicine

## 2011-08-11 LAB — PROTIME-INR: INR: 1.7

## 2011-12-04 ENCOUNTER — Encounter: Payer: Self-pay | Admitting: Nurse Practitioner

## 2011-12-04 ENCOUNTER — Encounter: Payer: Self-pay | Admitting: Cardiothoracic Surgery

## 2011-12-23 ENCOUNTER — Encounter: Payer: Self-pay | Admitting: Nurse Practitioner

## 2011-12-23 ENCOUNTER — Encounter: Payer: Self-pay | Admitting: Cardiothoracic Surgery

## 2013-02-23 ENCOUNTER — Encounter: Payer: Self-pay | Admitting: Podiatry

## 2013-02-23 ENCOUNTER — Ambulatory Visit (INDEPENDENT_AMBULATORY_CARE_PROVIDER_SITE_OTHER): Payer: Medicare Other | Admitting: Podiatry

## 2013-02-23 VITALS — BP 119/50 | HR 62 | Resp 16 | Ht 68.0 in | Wt 280.0 lb

## 2013-02-23 DIAGNOSIS — B351 Tinea unguium: Secondary | ICD-10-CM

## 2013-02-23 DIAGNOSIS — M79609 Pain in unspecified limb: Secondary | ICD-10-CM

## 2013-02-23 NOTE — Progress Notes (Signed)
  Subjective:    Patient ID: [name] [name], male    DOB: [birth year], 77 y.o.   MRN: [id number]  HPI    Review of Systems  Constitutional: Negative.   HENT: Negative.   Eyes: Negative.   Respiratory: Negative.   Cardiovascular: Negative.   Gastrointestinal: Negative.   Endocrine: Negative.   Genitourinary: Negative.   Allergic/Immunologic: Negative.   Neurological: Negative.   Hematological: Negative.   Psychiatric/Behavioral: Negative.        Objective:   Physical Exam: I have reviewed there is past medical history medications and allergies. Pulses remain palpable bilateral lower extremity. Nails are thick yellow dystrophic clinically mycotic. They're painful on palpation as well as debridement.        Assessment & Plan:  Impression: Pain in limb secondary to onychomycosis.  Plan: Debridement of nails in thickness and length as a covered service. Followup with him in 3 months.

## 2013-04-02 ENCOUNTER — Encounter: Payer: Self-pay | Admitting: Surgery

## 2013-04-23 ENCOUNTER — Encounter: Payer: Self-pay | Admitting: Surgery

## 2013-05-24 ENCOUNTER — Encounter: Payer: Self-pay | Admitting: Surgery

## 2013-05-25 ENCOUNTER — Encounter: Payer: Self-pay | Admitting: Podiatry

## 2013-05-25 ENCOUNTER — Ambulatory Visit (INDEPENDENT_AMBULATORY_CARE_PROVIDER_SITE_OTHER): Payer: Medicare Other | Admitting: Podiatry

## 2013-05-25 VITALS — BP 111/56 | HR 70 | Resp 20

## 2013-05-25 DIAGNOSIS — M79609 Pain in unspecified limb: Secondary | ICD-10-CM

## 2013-05-25 DIAGNOSIS — B351 Tinea unguium: Secondary | ICD-10-CM

## 2013-05-25 NOTE — Progress Notes (Signed)
Trim the toenails i think. They have become painful and rub my shoes.  Objective: Vital signs are stable he is alert and oriented x3. Pulses are palpable bilateral. Nails are thick yellow dystrophic onychomycotic and painful elongated.  Assessment: Pain in limb secondary to onychomycosis 1 through 5 bilateral.  Plan: Discussed etiology pathology conservative versus surgical therapies. Debridement nails 1 through 5 bilateral is a covered service. Followup with him in 3 months pair

## 2013-05-31 ENCOUNTER — Ambulatory Visit: Payer: Self-pay | Admitting: Family Medicine

## 2013-06-21 ENCOUNTER — Encounter: Payer: Self-pay | Admitting: Surgery

## 2013-08-10 ENCOUNTER — Ambulatory Visit: Payer: Self-pay | Admitting: Family Medicine

## 2013-08-24 ENCOUNTER — Ambulatory Visit: Payer: Medicare Other | Admitting: Podiatry

## 2013-09-09 ENCOUNTER — Ambulatory Visit: Payer: Medicare Other | Admitting: Podiatry

## 2013-09-21 DEATH — deceased

## 2013-12-23 IMAGING — CT CT ABDOMEN AND PELVIS WITHOUT AND WITH CONTRAST
2 of 4 series · 14 of 32 positions shown, 19 images · non-contrast
Comparison: none

REASON FOR EXAM: Hematuria
COMMENTS:

PROCEDURE:     KCT - KCT ABDOMEN/PELVIS W/WO  - April 25, 2011  [DATE]
RESULT:
Helical 3 mm sections were obtained from the lung bases pre, immediate and
delayed intravenous administration of 100 mL of Ssovue-ELL.

[Series 2: abd 3mm wo 3.0 i40f 3 · axial · 0.98mm/px · z∈[-245,+112]mm · 8 of 155 slices shown, 13 images]
[im 18/155  soft-tissue]
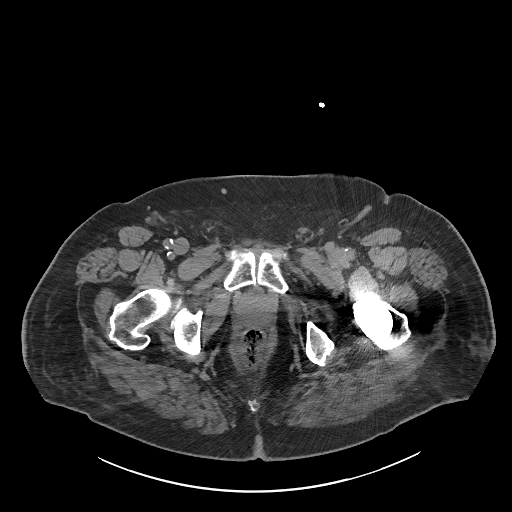
[im 18/155  bone]
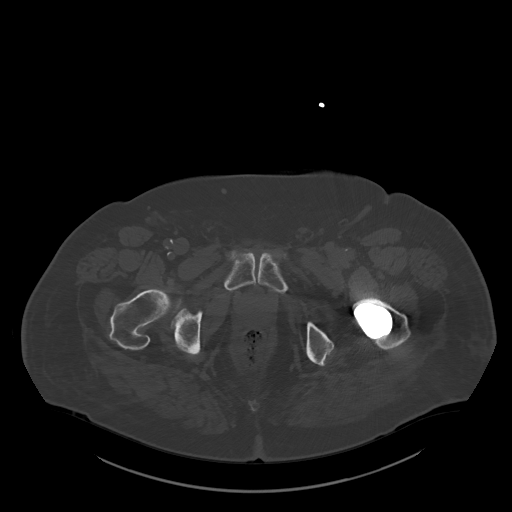
[im 35/155  soft-tissue]
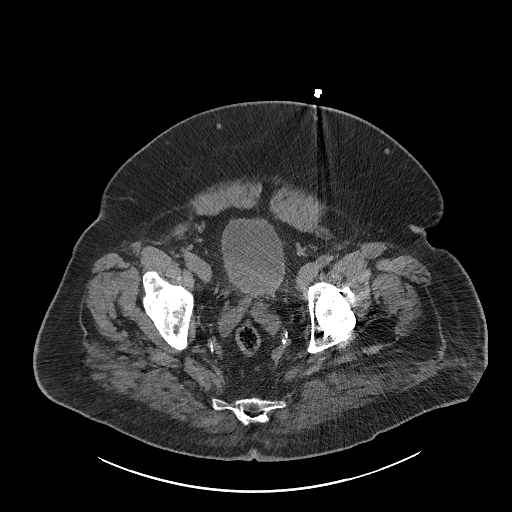
[im 52/155  soft-tissue]
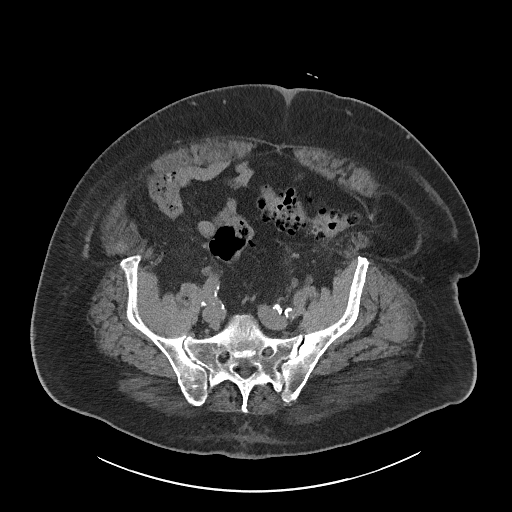
[im 69/155  soft-tissue]
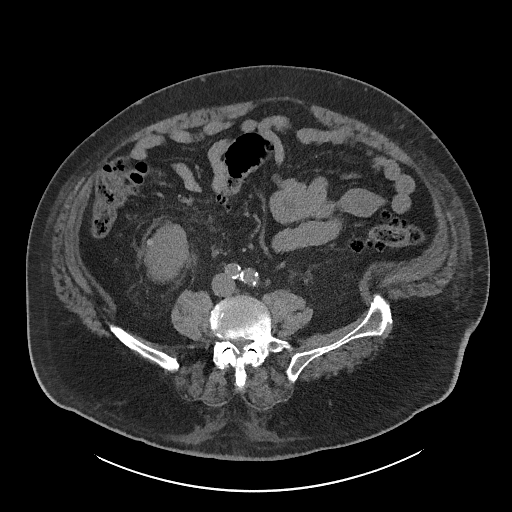
[im 86/155  soft-tissue]
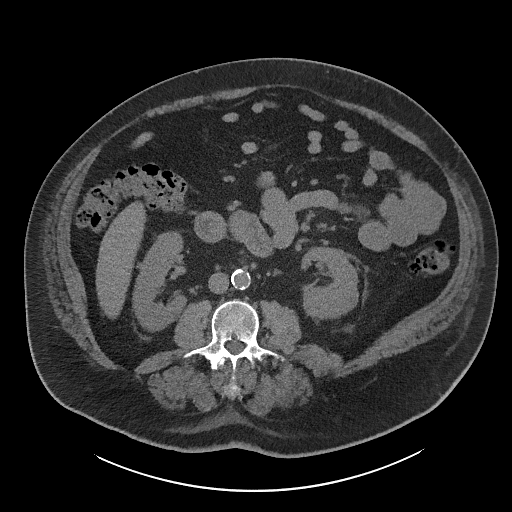
[im 86/155  lung]
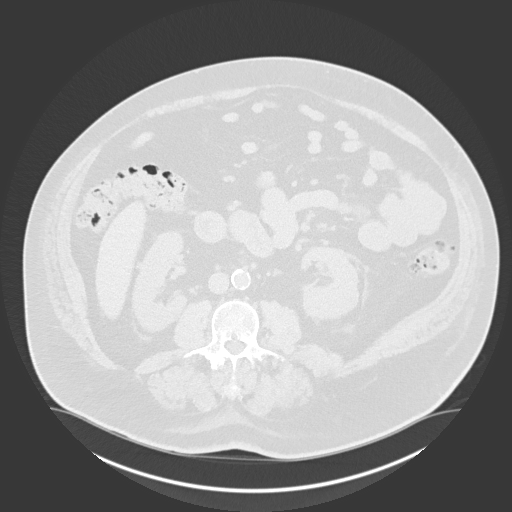
[im 103/155  soft-tissue]
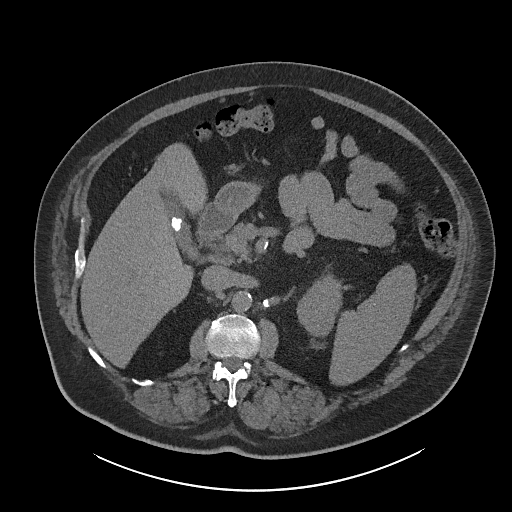
[im 103/155  lung]
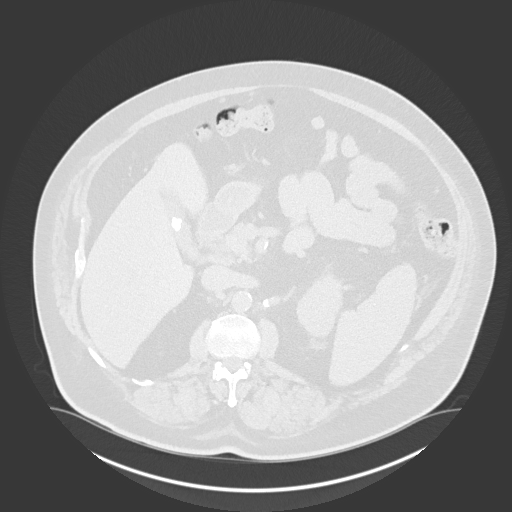
[im 120/155  soft-tissue]
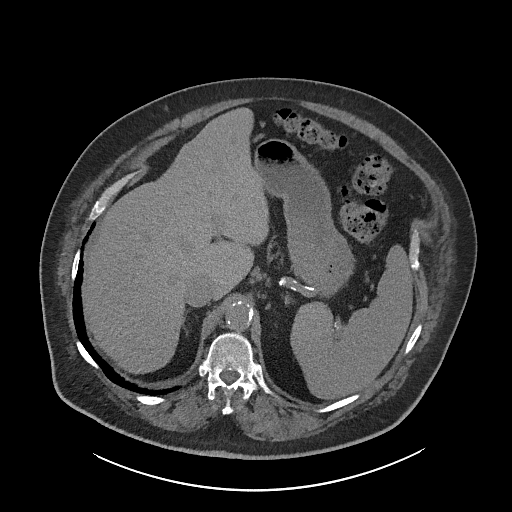
[im 120/155  lung]
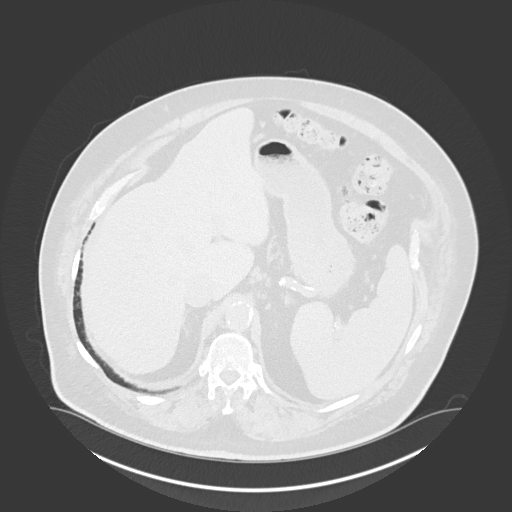
[im 137/155  soft-tissue]
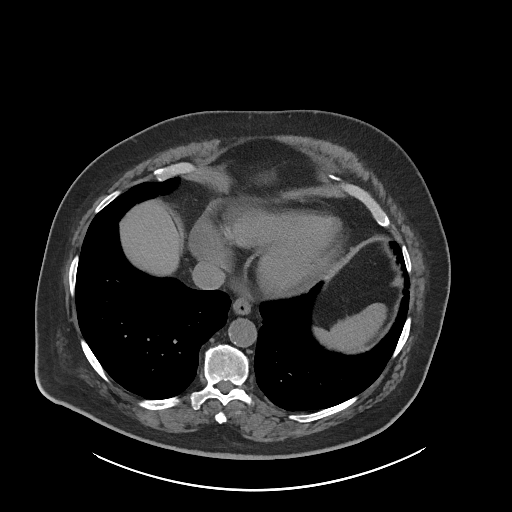
[im 137/155  lung]
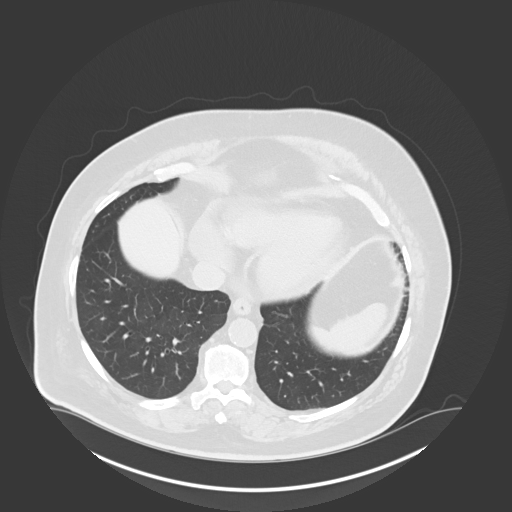

[Series 4: abd 3mm w 3.0 i40f 3 · axial · 0.98mm/px · z∈[-245,+10]mm · 6 of 155 slices shown]
[im 18/155  soft-tissue]
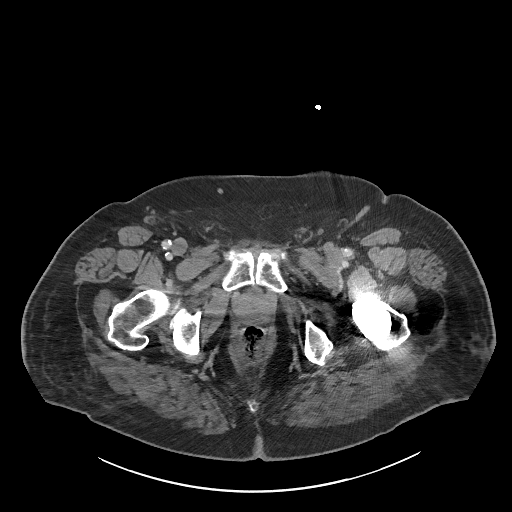
[im 35/155  soft-tissue]
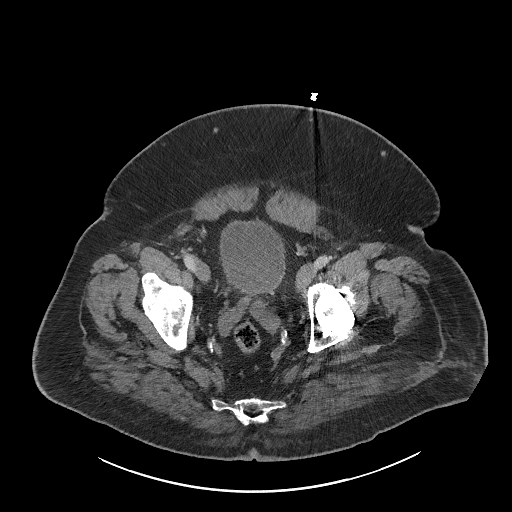
[im 52/155  soft-tissue]
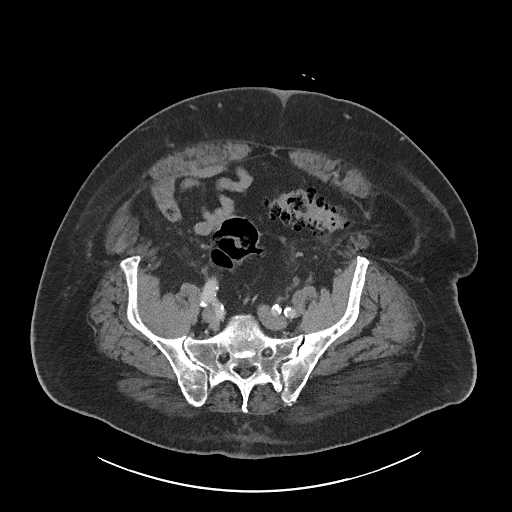
[im 69/155  soft-tissue]
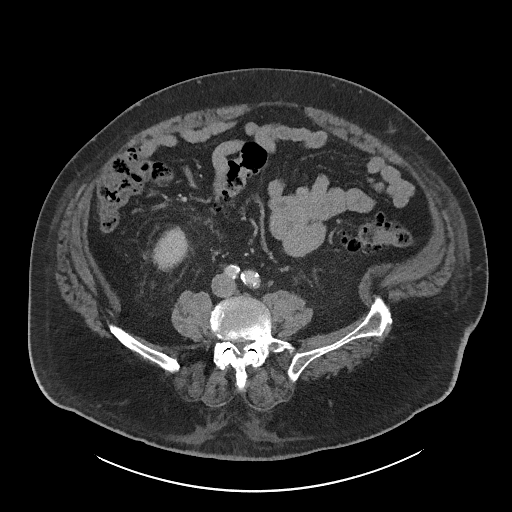
[im 86/155  soft-tissue]
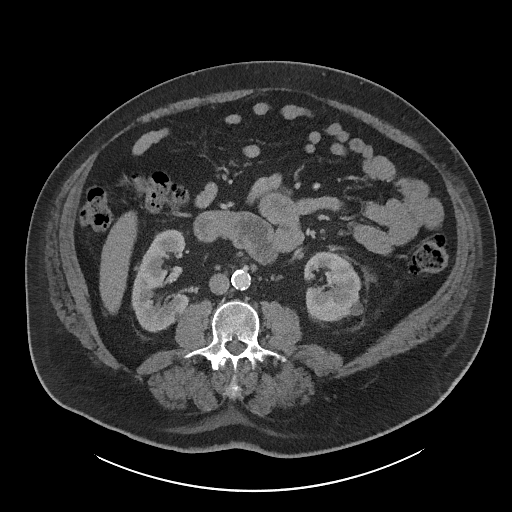
[im 103/155  soft-tissue]
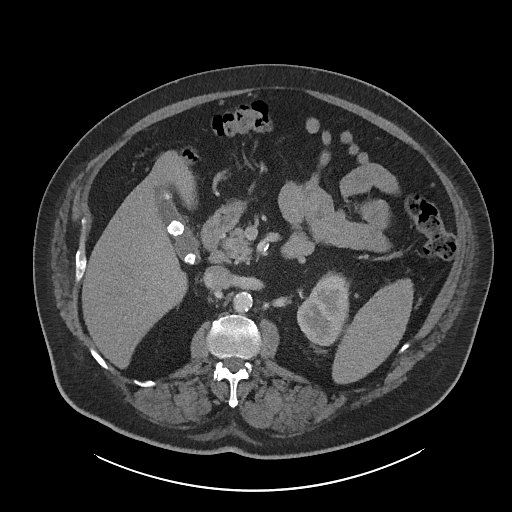

[14 of 32 positions shown; findings below may reference images not displayed]

FINDINGS: The lung bases are unremarkable.

There is no evidence of hydronephrosis, hydroureter, nephrolithiasis or
ureterolithiasis. Vascular calcifications are identified within the right
kidney. A small 1.2 cm hyperdense nodule projects along the anterior midpole
region of the right kidney. There does not appear to be appreciable
enhancement. Small 1.0 cm nonenhancing cyst is appreciated within the right
and left kidneys. No further evidence of renal masses or evidence of
hydronephrosis is identified.

The liver, spleen, adrenals and pancreas are unremarkable. There is no CT
evidence of bowel obstruction or evidence of enteritis, colitis or
diverticulitis. There is diverticulosis within the sigmoid colon. There is
no evidence of an abdominal aortic aneurysm. No evidence of abdominal pelvic
masses, free fluid or loculated fluid collections is identified. Calcified
gallstones are identified within the gallbladder.
IMPRESSION: 1. Findings likely reflecting a small hyperdense cyst within the anterior
lower pole region of the right kidney. Surveillance evaluation recommended
if clinically warranted.
2. Small 1.0 cm cyst within the right and left kidneys.
3. No further renal abnormality is identified.
4. Gallstones and diverticulosis.

## 2016-04-09 IMAGING — CR DG CHEST 2V
1 series · 3 of 3 positions shown · non-contrast
Comparison: DG CHEST 2V dated 08/11/2010

CLINICAL DATA: Cough

EXAM:
CHEST  2 VIEW

[Series 1: pa · 0.17mm/px · 3 of 3 slices shown]
[im 1/3]
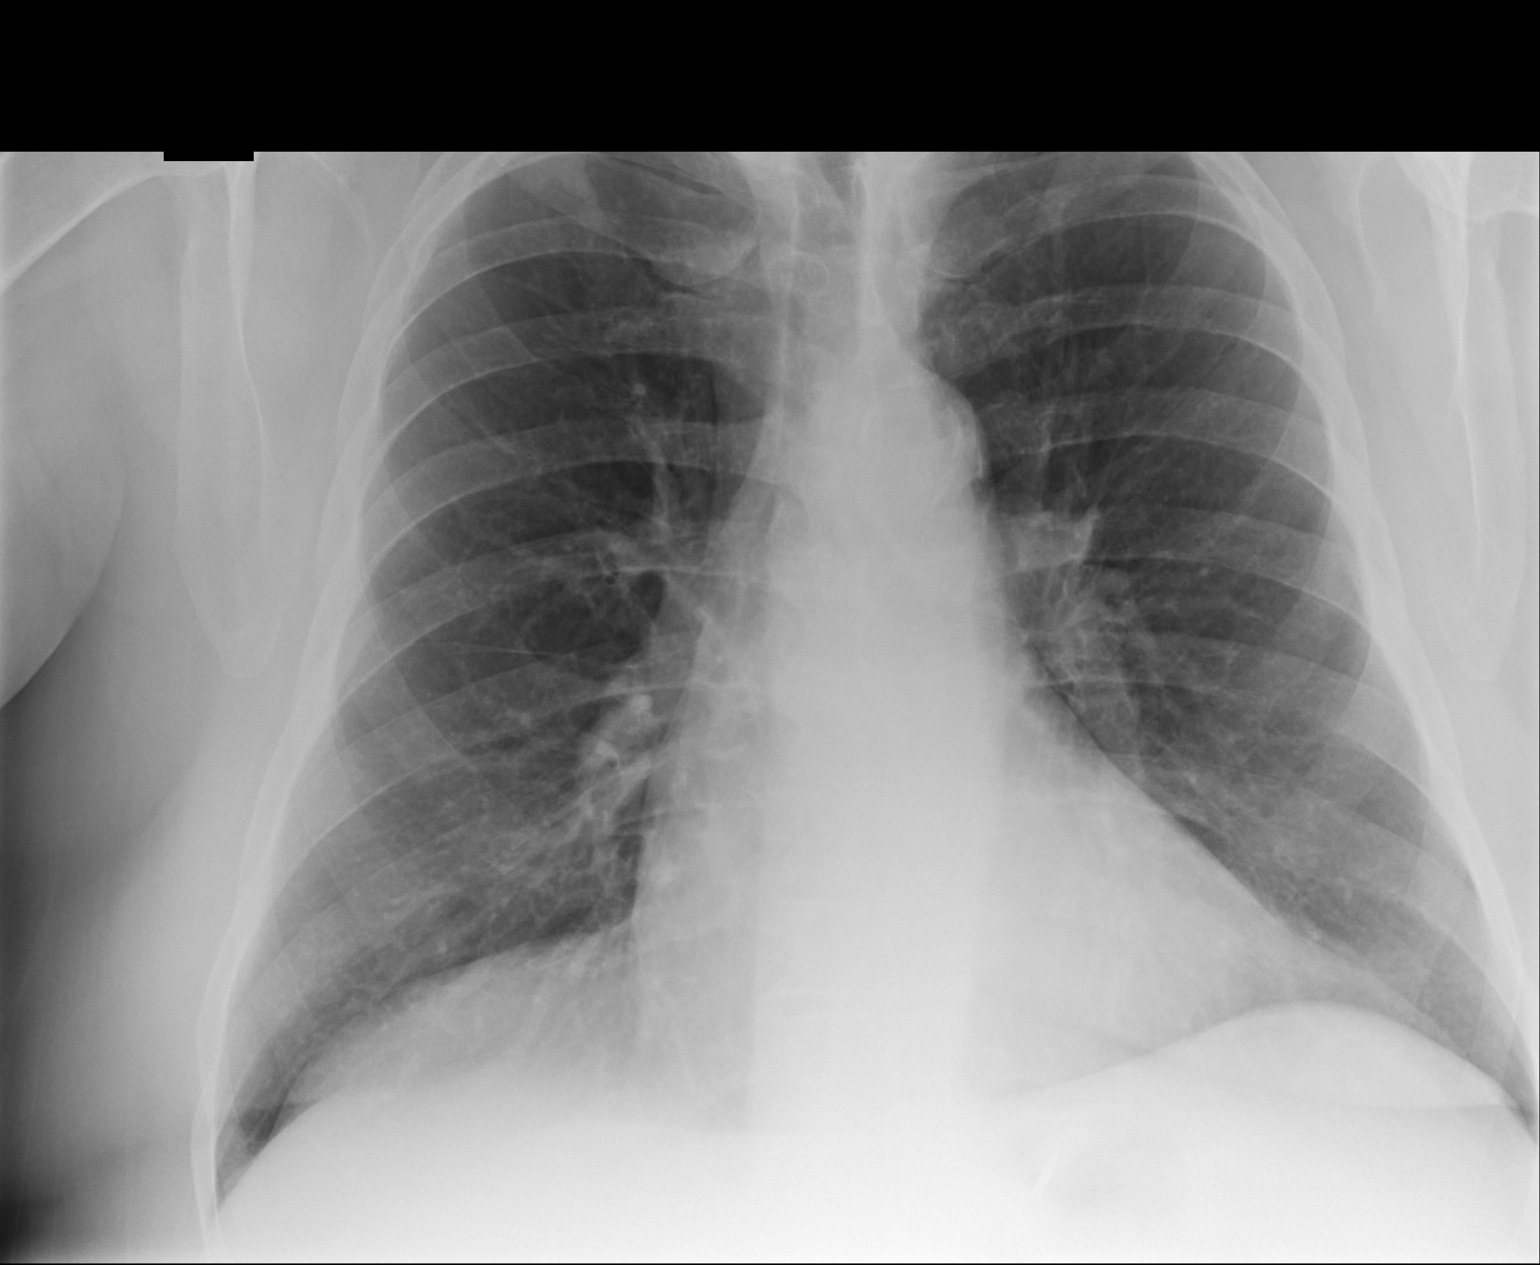
[im 2/3]
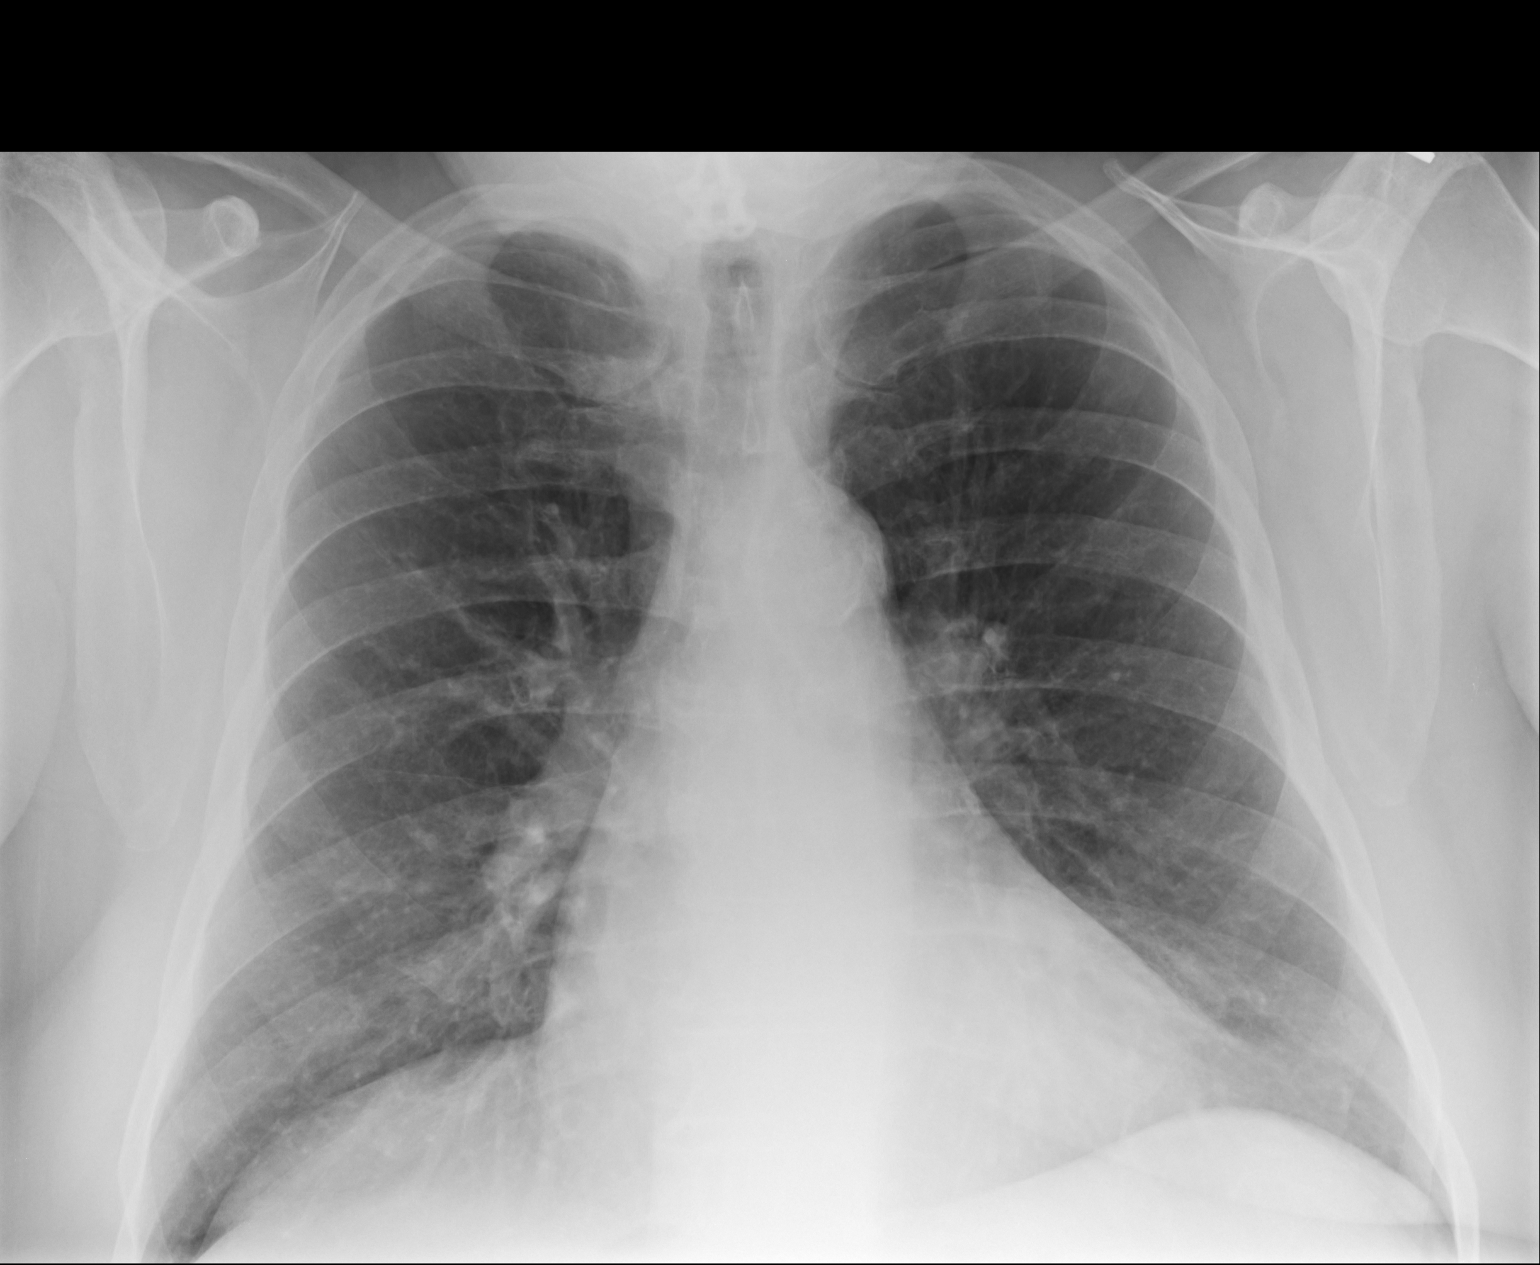
[im 3/3]
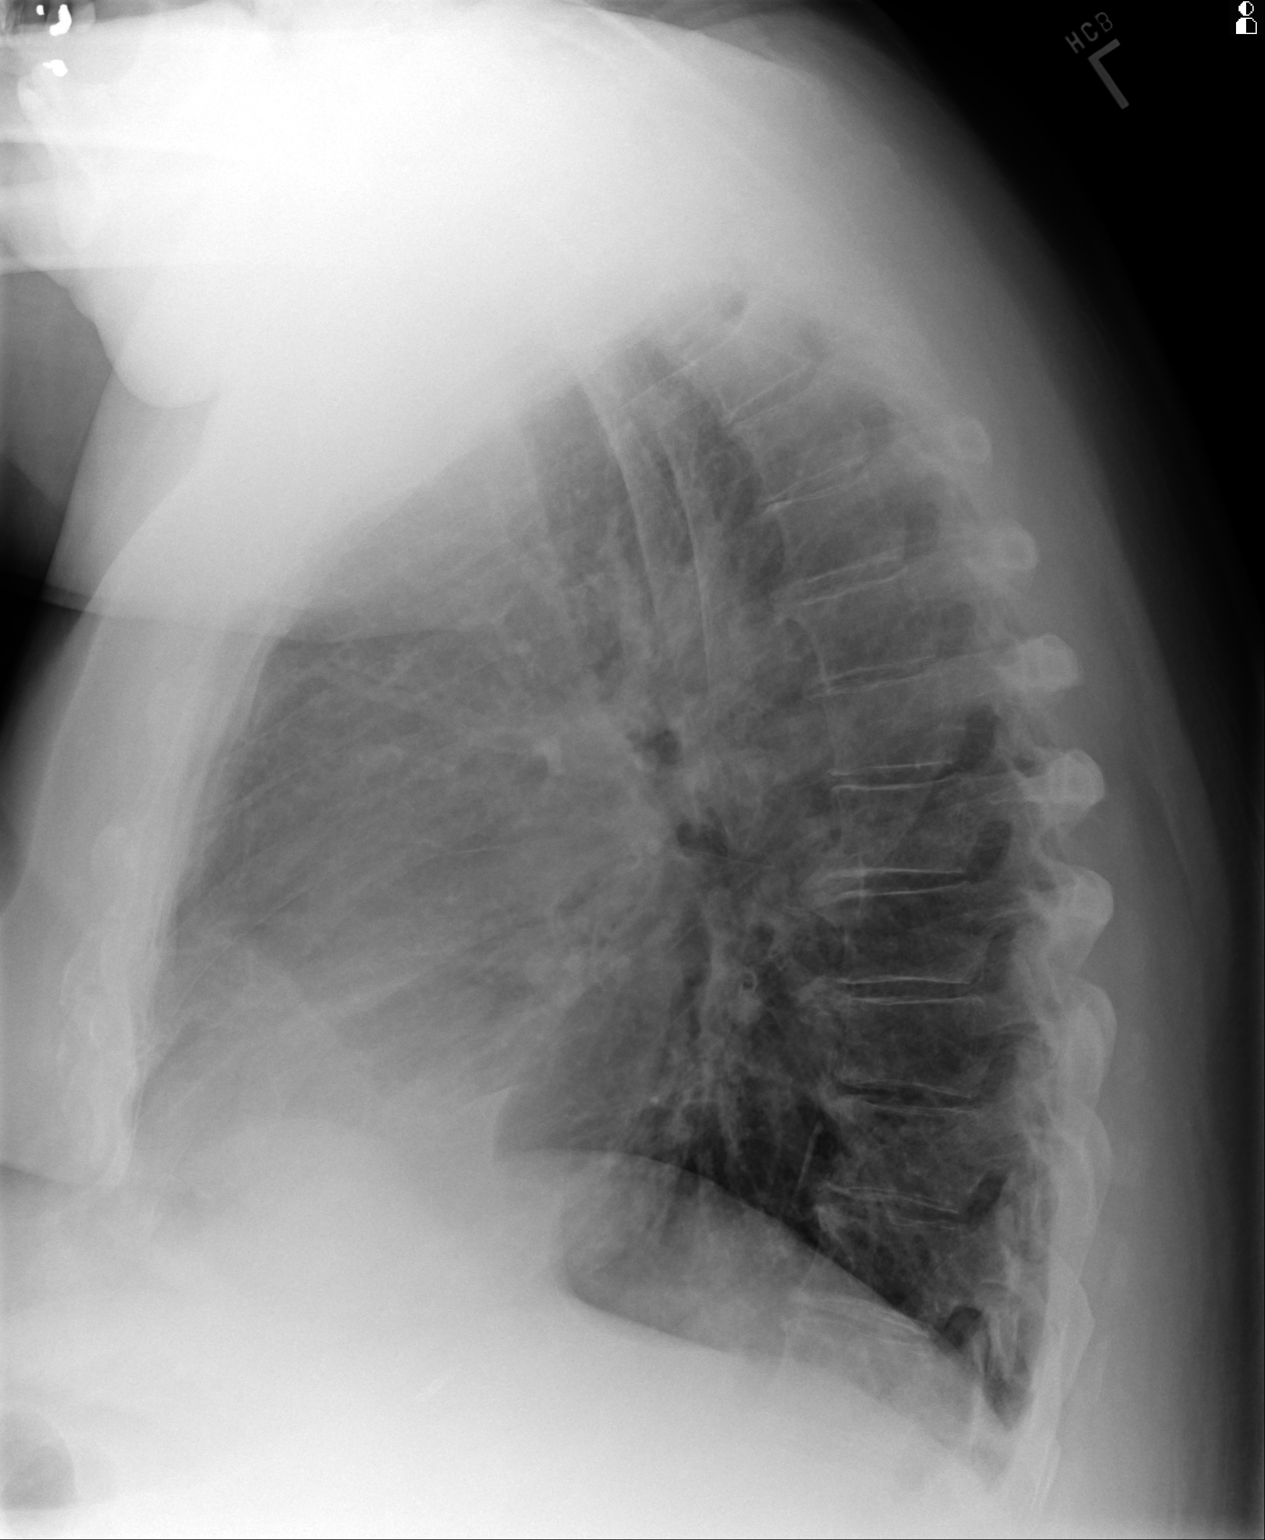

[3 of 3 positions shown; findings below may reference images not displayed]

FINDINGS: The heart size and mediastinal contours are within normal limits.
Both lungs are clear. The visualized skeletal structures are
unremarkable.
IMPRESSION: No active cardiopulmonary disease.
